# Patient Record
Sex: Male | Born: 1995 | Race: White | Hispanic: No | Marital: Single | Smoking: Never smoker
Health system: Southern US, Community
[De-identification: ages and names within clinical notes are randomized; demographics above are authoritative.]

## PROBLEM LIST (undated history)

## (undated) DIAGNOSIS — F909 Attention-deficit hyperactivity disorder, unspecified type: Secondary | ICD-10-CM

## (undated) HISTORY — PX: OTHER SURGICAL HISTORY: SHX169

## (undated) HISTORY — PX: TYMPANOSTOMY TUBE PLACEMENT: SHX32

## (undated) HISTORY — DX: Attention-deficit hyperactivity disorder, unspecified type: F90.9

---
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
## 2017-06-27 ENCOUNTER — Encounter: Payer: Self-pay | Admitting: Family Medicine

## 2017-06-27 ENCOUNTER — Ambulatory Visit (INDEPENDENT_AMBULATORY_CARE_PROVIDER_SITE_OTHER): Payer: 59 | Admitting: Family Medicine

## 2017-06-27 VITALS — BP 111/74 | HR 81 | Ht 70.75 in | Wt 176.9 lb

## 2017-06-27 DIAGNOSIS — F909 Attention-deficit hyperactivity disorder, unspecified type: Secondary | ICD-10-CM | POA: Diagnosis not present

## 2017-06-27 DIAGNOSIS — E559 Vitamin D deficiency, unspecified: Secondary | ICD-10-CM | POA: Diagnosis not present

## 2017-06-27 DIAGNOSIS — F419 Anxiety disorder, unspecified: Secondary | ICD-10-CM

## 2017-06-27 DIAGNOSIS — E782 Mixed hyperlipidemia: Secondary | ICD-10-CM

## 2017-06-27 MED ORDER — VITAMIN D3 125 MCG (5000 UT) PO TABS: ORAL_TABLET | ORAL | 3 refills | Status: AC

## 2017-06-27 NOTE — Patient Instructions (Addendum)
Please get over-the-counter vitamin D 5000 IUs and take that daily.  We will recheck her vitamin D levels in 6-12 months.  Also please try to get back into the gym and try to exercise or do some kind of cardio at least 5 days per week for at least 30 minutes.  This can help tremendously with mood focus and overall sense of well-being.    - Please realize, EXERCISE IS MEDICINE!  -  American Heart Association [facility]) guidelines for exercise : If you are in good health, without any medical conditions, you should engage in 150 minutes of moderate intensity aerobic activity per week.  This means you should be huffing and puffing throughout your workout.   Engaging in regular exercise will improve brain function and memory, as well as improve mood, boost immune system and help with weight management.  As well as the other, more well-known effects of exercise such as decreasing blood sugar levels, decreasing blood pressure,  and decreasing bad cholesterol levels/ increasing good cholesterol levels.     -  The AHA strongly endorses consumption of a diet that contains a variety of foods from all the food categories with an emphasis on fruits and vegetables; fat-free and low-fat dairy products; cereal and grain products; legumes and nuts; and fish, poultry, and/or extra lean meats.    Excessive food intake, especially of foods high in saturated and trans fats, sugar, and salt, should be avoided.    Adequate water intake of roughly 1/2 of your weight in pounds, should equal the ounces of water per day you should drink.  So for instance, if you're 200 pounds, that would be 100 ounces of water per day.         Mediterranean Diet  Why follow it? Research shows. . Those who follow the Mediterranean diet have a reduced risk of heart disease  . The diet is associated with a reduced incidence of Parkinson's and Alzheimer's diseases . People following the diet may have longer life expectancies and lower rates of  chronic diseases  . The Dietary Guidelines for Americans recommends the Mediterranean diet as an eating plan to promote health and prevent disease  What Is the Mediterranean Diet?  . Healthy eating plan based on typical foods and recipes of Mediterranean-style cooking . The diet is primarily a plant based diet; these foods should make up a majority of meals   Starches - Plant based foods should make up a majority of meals - They are an important sources of vitamins, minerals, energy, antioxidants, and fiber - Choose whole grains, foods high in fiber and minimally processed items  - Typical grain sources include wheat, oats, barley, corn, brown rice, bulgar, farro, millet, polenta, couscous  - Various types of beans include chickpeas, lentils, fava beans, black beans, white beans   Fruits  Veggies - Large quantities of antioxidant rich fruits & veggies; 6 or more servings  - Vegetables can be eaten raw or lightly drizzled with oil and cooked  - Vegetables common to the traditional Mediterranean Diet include: artichokes, arugula, beets, broccoli, brussel sprouts, cabbage, carrots, celery, collard greens, cucumbers, eggplant, kale, leeks, lemons, lettuce, mushrooms, okra, onions, peas, peppers, potatoes, pumpkin, radishes, rutabaga, shallots, spinach, sweet potatoes, turnips, zucchini - Fruits common to the Mediterranean Diet include: apples, apricots, avocados, cherries, clementines, dates, figs, grapefruits, grapes, melons, nectarines, oranges, peaches, pears, pomegranates, strawberries, tangerines  Fats - Replace butter and margarine with healthy oils, such as olive oil, canola oil, and tahini  -  Limit nuts to no more than a handful a day  - Nuts include walnuts, almonds, pecans, pistachios, pine nuts  - Limit or avoid candied, honey roasted or heavily salted nuts - Olives are central to the Mediterranean diet - can be eaten whole or used in a variety of dishes   Meats Protein - Limiting red  meat: no more than a few times a month - When eating red meat: choose lean cuts and keep the portion to the size of deck of cards - Eggs: approx. 0 to 4 times a week  - Fish and lean poultry: at least 2 a week  - Healthy protein sources include, chicken, [place], lean beef, lamb - Increase intake of seafood such as tuna, salmon, trout, mackerel, shrimp, scallops - Avoid or limit high fat processed meats such as sausage and bacon  Dairy - Include moderate amounts of low fat dairy products  - Focus on healthy dairy such as fat free yogurt, skim milk, low or reduced fat cheese - Limit dairy products higher in fat such as whole or 2% milk, cheese, ice cream  Alcohol - Moderate amounts of red wine is ok  - No more than 5 oz daily for women (all ages) and men older than age 50  - No more than 10 oz of wine daily for men younger than 12  Other - Limit sweets and other desserts  - Use herbs and spices instead of salt to flavor foods  - Herbs and spices common to the traditional Mediterranean Diet include: basil, bay leaves, chives, cloves, cumin, fennel, garlic, lavender, marjoram, mint, oregano, parsley, pepper, rosemary, sage, savory, sumac, tarragon, thyme   It's not just a diet, it's a lifestyle:  . The Mediterranean diet includes lifestyle factors typical of those in the region  . Foods, drinks and meals are best eaten with others and savored . Daily physical activity is important for overall good health . This could be strenuous exercise like running and aerobics . This could also be more leisurely activities such as walking, housework, yard-work, or taking the stairs . Moderation is the key; a balanced and healthy diet accommodates most foods and drinks . Consider portion sizes and frequency of consumption of certain foods   Meal Ideas & Options:  . Breakfast:  o Whole wheat toast or whole wheat English muffins with peanut butter & hard boiled egg o Steel cut oats topped with apples &  cinnamon and skim milk  o Fresh fruit: banana, strawberries, melon, berries, peaches  o Smoothies: strawberries, bananas, greek yogurt, peanut butter o Low fat greek yogurt with blueberries and granola  o Egg white omelet with spinach and mushrooms o Breakfast couscous: whole wheat couscous, apricots, skim milk, cranberries  . Sandwiches:  o Hummus and grilled vegetables (peppers, zucchini, squash) on whole wheat bread   o Grilled chicken on whole wheat pita with lettuce, tomatoes, cucumbers or tzatziki  o Tuna salad on whole wheat bread: tuna salad made with greek yogurt, olives, red peppers, capers, green onions o Garlic rosemary lamb pita: lamb sauted with garlic, rosemary, salt & pepper; add lettuce, cucumber, greek yogurt to pita - flavor with lemon juice and black pepper  . Seafood:  o Mediterranean grilled salmon, seasoned with garlic, basil, parsley, lemon juice and black pepper o Shrimp, lemon, and spinach whole-grain pasta salad made with low fat greek yogurt  o Seared scallops with lemon orzo  o Seared tuna steaks seasoned salt, pepper, coriander topped with tomato  mixture of olives, tomatoes, olive oil, minced garlic, parsley, green onions and cappers  . Meats:  o Herbed greek chicken salad with kalamata olives, cucumber, feta  o Red bell peppers stuffed with spinach, bulgur, lean ground beef (or lentils) & topped with feta   o Kebabs: skewers of chicken, tomatoes, onions, zucchini, squash  o [place] burgers: made with red onions, mint, dill, lemon juice, feta cheese topped with roasted red peppers . Vegetarian o Cucumber salad: cucumbers, artichoke hearts, celery, red onion, feta cheese, tossed in olive oil & lemon juice  o Hummus and whole grain pita points with a greek salad (lettuce, tomato, feta, olives, cucumbers, red onion) o Lentil soup with celery, carrots made with vegetable broth, garlic, salt and pepper  o Tabouli salad: parsley, bulgur, mint, scallions, cucumbers,  tomato, radishes, lemon juice, olive oil, salt and pepper.     Behavioral Health Referrals    Please contact Theador Hawthorne, at [phone] as she might be a very good Counselor for young adults who are trying to figure out what they would like to do with her life.  Francee Nodal, ACC  33 [phone] [email or web address] ( she does [place] and faith-based coaching and counseling)   Bethann Berkshire -[email or web address] [employer]- gen counseling;  PH-D   Corine Shelter, MSW [address] [place] [phone]   [facility] Caralyn Guile, PhD [address], [facility] [phone]  [facility] [address] [place], [place] [phone]  Heloise Beecham Professional Counselor Counseling and [employer] [phone]  [facility] abuse [facility] Manager [address], [place] [phone]  [facility] [address] [place] [phone] Eliott Nine, PhD Dayton Scrape, PhD Hollace Kinnier, LCSW Andrena Mews, PhD-child, adolescent and adults  [facility] [address], [place] Otto [phone] Daun Peacock, MS-child, adolescent and adults Madelaine Etienne, PhD-adolescent and adults  [facility]-grief, terminal illness [address] [place], [place] [phone]  [facility] [address], [place] [phone] [facility] [address] [place] [phone]  [facility] [address], [place] [phone]  [facility] [address] [place], [place] [phone]  [facility] of the [facility] [address], [place] [phone]  [facility] [address],  [address], [place] [phone]  [facility] [address], [place] [phone]

## 2017-06-27 NOTE — Progress Notes (Signed)
Impression and Recommendations:    1. Attention deficit hyperactivity disorder (ADHD), unspecified ADHD type   2. Anxious mood  at times   3. Vitamin D insufficiency   4. Elevated triglycerides with high cholesterol    - Off stimulants for now.  Discussed with patient personal strategies for success.  He will follow up sooner than planned if any symptoms arise.  - Recommend counselor.  Theador Hawthorne specializes in younger folks trying to figure out what they want to do in  life.  I gave patient contact information for her and a list of other counselors in the area.  - I reviewed labs from January with patient in the office today.  All questions were answered.  - Encouraged to exercise daily, drink more water, take over-the-counter vitamin D supplements.   No problem-specific Assessment & Plan notes found for this encounter.   The patient was counseled, risk factors were discussed, anticipatory guidance given.   New Prescriptions   CHOLECALCIFEROL (VITAMIN D3) 5000 UNITS TABS    5,000 IU OTC vitamin D3 daily.     Discontinued Medications   METHYLPHENIDATE 36 MG PO CR TABLET    Take 2 tablets (72 mg total) by mouth daily.   METHYLPHENIDATE 36 MG PO CR TABLET    Take 2 tablets (72 mg total) by mouth daily.   METHYLPHENIDATE 36 MG PO CR TABLET    Take 2 tablets (72 mg total) by mouth daily.     Modified Medications   No medications on file      No orders of the defined types were placed in this encounter.    Gross side effects, risk and benefits, and alternatives of medications and treatment plan in general discussed with patient.  Patient is aware that all medications have potential side effects and we are unable to predict every side effect or drug-drug interaction that may occur.   Patient will call with any questions prior to using medication if they have concerns.  Expresses verbal understanding and consents to current therapy and treatment regimen.  No barriers  to understanding were identified.  Red flag symptoms and signs discussed in detail.  Patient expressed understanding regarding what to do in case of emergency\urgent symptoms  Please see AVS handed out to patient at the end of our visit for further patient instructions/ counseling done pertaining to today's office visit.   Return in about 4 months (around 10/27/2017) for f/up adhd, exercise, vit D, see if went to counselor.     Note: This document was prepared using Dragon voice recognition software and may include unintentional dictation errors.  Thomasene Lot 10:43 AM --------------------------------------------------------------------------------------------------------------------------------------------------------------------------------------------------------------------------------------------    Subjective:    CC:  Chief Complaint  Patient presents with  . ADHD    follow up     HPI: Corey Mooney is a 21 y.o. male who presents to [facility] today for issues as discussed below.   Doing well.   Hasn't been taking it for couple months now.   Feels less stressed now.   Went away to college-->  [employer]- [place], [place].  He did sports\track in college as well as his academics.  He was too much stress.  He left after 1 yr -  Back from [place] for about one year now.   going to [employer] now- fulltime- started two weeks ago.    Feels much better off the meds.  Feels like he can focus fine  at college.   Not working now- living with Mom.        No problems updated.  Depression screen [facility] 2/9 06/27/2017  Decreased Interest 1  Down, Depressed, Hopeless 1  PHQ - 2 Score 2  Altered sleeping 1  Tired, decreased energy 1  Change in appetite 0  Feeling bad or failure about yourself  1  Trouble concentrating 1  Moving slowly or fidgety/restless 0  Suicidal thoughts 1  PHQ-9 Score 7  Difficult doing work/chores Somewhat difficult    Wt  Readings from Last 3 Encounters:  06/27/17 176 lb 14.4 oz (80.2 kg) (80 %, Z= 0.85)*  11/03/16 177 lb 6.4 oz (80.5 kg) (83 %, Z= 0.94)*  08/11/16 163 lb 3.2 oz (74 kg) (70 %, Z= 0.53)*   * Growth percentiles are based on CDC 2-20 Years data.   BP Readings from Last 3 Encounters:  06/27/17 111/74  11/03/16 99/62  08/11/16 104/64   Pulse Readings from Last 3 Encounters:  06/27/17 81  11/03/16 70  08/11/16 87   BMI Readings from Last 3 Encounters:  06/27/17 24.85 kg/m (75 %, Z= 0.67)*  11/03/16 24.92 kg/m (79 %, Z= 0.80)*  08/11/16 22.92 kg/m (61 %, Z= 0.29)*   * Growth percentiles are based on CDC 2-20 Years data.     Patient Care Team    Relationship Specialty Notifications Start End  Thomasene Lot, DO PCP - General Family Medicine  08/11/16      Patient Active Problem List   Diagnosis Date Noted  . ADHD 08/11/2016    Priority: Medium  . Anxious mood  at times 08/11/2016    Priority: Low    Past Medical history, Surgical history, Family history, Social history, Allergies and Medications have been entered into the medical record, reviewed and changed as needed.    Current Meds  Medication Sig  . [DISCONTINUED] methylphenidate 36 MG PO CR tablet Take 2 tablets (72 mg total) by mouth daily.    Allergies:  No Known Allergies   Review of Systems: General:   Denies fever, chills, unexplained weight loss.  Optho/Auditory:   Denies visual changes, blurred vision/LOV Respiratory:   Denies wheeze, DOE more than baseline levels.  Cardiovascular:   Denies chest pain, palpitations, new onset peripheral edema  Gastrointestinal:   Denies nausea, vomiting, diarrhea, abd pain.  Genitourinary: Denies dysuria, freq/ urgency, flank pain or discharge from genitals.  Endocrine:     Denies hot or cold intolerance, polyuria, polydipsia. Musculoskeletal:   Denies unexplained myalgias, joint swelling, unexplained arthralgias, gait problems.  Skin:  Denies new onset rash,  suspicious lesions Neurological:     Denies dizziness, unexplained weakness, numbness  Psychiatric/Behavioral:   Denies mood changes, suicidal or homicidal ideations, hallucinations    Objective:   Blood pressure 111/74, pulse 81, height 5' 10.75" (1.797 m), weight 176 lb 14.4 oz (80.2 kg). Body mass index is 24.85 kg/m. General:  Well Developed, well nourished, appropriate for stated age.  Neuro:  Alert and oriented,  extra-ocular muscles intact  HEENT:  Normocephalic, atraumatic, neck supple, no carotid bruits appreciated  Skin:  no gross rash, warm, pink. Cardiac:  RRR, S1 S2 Respiratory:  ECTA B/L and A/P, Not using accessory muscles, speaking in full sentences- unlabored. Vascular:  Ext warm, no cyanosis apprec.; cap RF less 2 sec. Psych:  No HI/SI, judgement and insight good, Euthymic mood. Full Affect.

## 2017-09-09 DIAGNOSIS — Z23 Encounter for immunization: Secondary | ICD-10-CM | POA: Diagnosis not present

## 2017-10-27 ENCOUNTER — Ambulatory Visit: Payer: 59 | Admitting: Family Medicine

## 2018-03-17 DIAGNOSIS — R300 Dysuria: Secondary | ICD-10-CM | POA: Diagnosis not present

## 2018-03-17 DIAGNOSIS — R369 Urethral discharge, unspecified: Secondary | ICD-10-CM | POA: Diagnosis not present

## 2020-02-01 ENCOUNTER — Ambulatory Visit: Payer: 59 | Attending: Internal Medicine

## 2020-02-01 DIAGNOSIS — Z23 Encounter for immunization: Secondary | ICD-10-CM

## 2020-02-01 NOTE — Progress Notes (Signed)
   Covid-19 Vaccination Clinic  Name:  Corey Mooney    MRN: [id number] DOB: [birth year]  02/01/2020  Mr. [name] was observed post Covid-19 immunization for 15 minutes without incident. He was provided with Vaccine Information Sheet and instruction to access the V-Safe system.   Mr. [name] was instructed to call 911 with any severe reactions post vaccine: Marland Kitchen Difficulty breathing  . Swelling of face and throat  . A fast heartbeat  . A bad rash all over body  . Dizziness and weakness   Immunizations Administered    Name Date Dose VIS Date Route   Pfizer COVID-19 Vaccine 02/01/2020  3:14 PM 0.3 mL 10/11/2019 Intramuscular   Manufacturer: [employer], [employer]   Lot: [id number]   NDC: [address]-7

## 2020-02-25 ENCOUNTER — Ambulatory Visit: Payer: 59 | Attending: Internal Medicine

## 2020-02-25 DIAGNOSIS — Z23 Encounter for immunization: Secondary | ICD-10-CM

## 2020-02-25 NOTE — Progress Notes (Signed)
   Covid-19 Vaccination Clinic  Name:  Corey Mooney    MRN: [id number] DOB: 11-09-1995  02/25/2020  [name] was observed post Covid-19 immunization for 15 minutes without incident. He was provided with Vaccine Information Sheet and instruction to access the V-Safe system.   Mr. Weekly was instructed to call 911 with any severe reactions post vaccine: Marland Kitchen Difficulty breathing  . Swelling of face and throat  . A fast heartbeat  . A bad rash all over body  . Dizziness and weakness   Immunizations Administered    Name Date Dose VIS Date Route   Pfizer COVID-19 Vaccine 02/25/2020 10:49 AM 0.3 mL 12/25/2018 Intramuscular   Manufacturer: [employer], [employer]   Lot: [id number]   NDC: [address]-9

## 2022-07-16 ENCOUNTER — Ambulatory Visit: Payer: 59 | Admitting: Family Medicine
